# Patient Record
Sex: Female | Born: 1965 | Race: Black or African American | Hispanic: No | Smoking: Never smoker
Health system: Southern US, Community
[De-identification: ages and names within clinical notes are randomized; demographics above are authoritative.]

## PROBLEM LIST (undated history)

## (undated) DIAGNOSIS — E7800 Pure hypercholesterolemia, unspecified: Secondary | ICD-10-CM

## (undated) DIAGNOSIS — E079 Disorder of thyroid, unspecified: Secondary | ICD-10-CM

## (undated) HISTORY — PX: BREAST CYST ASPIRATION: SHX578

## (undated) HISTORY — PX: MYOMECTOMY: SHX85

---

## 2002-10-08 ENCOUNTER — Ambulatory Visit (HOSPITAL_COMMUNITY): Admission: RE | Admit: 2002-10-08 | Discharge: 2002-10-08 | Payer: Self-pay

## 2003-03-23 ENCOUNTER — Ambulatory Visit: Admission: RE | Admit: 2003-03-23 | Discharge: 2003-03-23 | Payer: Self-pay | Admitting: Gynecology

## 2003-03-30 ENCOUNTER — Inpatient Hospital Stay (HOSPITAL_COMMUNITY): Admission: RE | Admit: 2003-03-30 | Discharge: 2003-03-31 | Payer: Self-pay | Admitting: Gynecology

## 2003-03-30 ENCOUNTER — Encounter (INDEPENDENT_AMBULATORY_CARE_PROVIDER_SITE_OTHER): Payer: Self-pay | Admitting: *Deleted

## 2003-09-25 ENCOUNTER — Other Ambulatory Visit: Admission: RE | Admit: 2003-09-25 | Discharge: 2003-09-25 | Payer: Self-pay | Admitting: Gynecology

## 2005-06-26 ENCOUNTER — Other Ambulatory Visit: Admission: RE | Admit: 2005-06-26 | Discharge: 2005-06-26 | Payer: Self-pay | Admitting: Gynecology

## 2007-06-16 ENCOUNTER — Other Ambulatory Visit: Admission: RE | Admit: 2007-06-16 | Discharge: 2007-06-16 | Payer: Self-pay | Admitting: Gynecology

## 2007-08-26 ENCOUNTER — Ambulatory Visit (HOSPITAL_COMMUNITY): Admission: RE | Admit: 2007-08-26 | Discharge: 2007-08-26 | Payer: Self-pay | Admitting: Gynecology

## 2008-09-07 ENCOUNTER — Encounter: Admission: RE | Admit: 2008-09-07 | Discharge: 2008-09-07 | Payer: Self-pay | Admitting: Family Medicine

## 2008-11-06 ENCOUNTER — Ambulatory Visit (HOSPITAL_COMMUNITY): Admission: RE | Admit: 2008-11-06 | Discharge: 2008-11-06 | Payer: Self-pay | Admitting: Gynecology

## 2009-04-26 ENCOUNTER — Observation Stay (HOSPITAL_COMMUNITY): Admission: AD | Admit: 2009-04-26 | Discharge: 2009-04-28 | Payer: Self-pay | Admitting: Obstetrics and Gynecology

## 2009-04-27 ENCOUNTER — Encounter: Payer: Self-pay | Admitting: Obstetrics and Gynecology

## 2009-06-13 ENCOUNTER — Inpatient Hospital Stay (HOSPITAL_COMMUNITY): Admission: RE | Admit: 2009-06-13 | Discharge: 2009-06-16 | Payer: Self-pay | Admitting: Obstetrics and Gynecology

## 2010-06-23 LAB — STREP B DNA PROBE

## 2010-07-01 LAB — CBC
HCT: 25.3 % — ABNORMAL LOW (ref 36.0–46.0)
MCHC: 33.7 g/dL (ref 30.0–36.0)
MCV: 89.7 fL (ref 78.0–100.0)
Platelets: 265 10*3/uL (ref 150–400)
Platelets: 337 10*3/uL (ref 150–400)
RDW: 14.9 % (ref 11.5–15.5)

## 2010-07-01 LAB — RPR: RPR Ser Ql: NONREACTIVE

## 2010-08-20 NOTE — Letter (Signed)
November 06, 2008    Leatha Gilding. Mezer, MD  [address]  [place] [address]   REMarland Kitchen   VICTORIANA, AZIZ  MR#   [id number]  ACC#        [id number]   MFM CONSULTATION REPORT   Dear Dr. Chevis Pretty:   I first want to thank you for sending your patient Kari Garrett-  Hilda Blades to me for a pregnancy consultation.  As you know, she is a 45-  year-old gravida 2, para 0, African American female, who was recently  diagnosed with a early pregnancy by ultrasound in your office.  Her  pregnancy is complicated by the following:  1.  History of fibroids, status post myomectomy.  2.  Advanced maternal age - age 49.  3.  History of question of Graves disease, status post thyroid ablation.   I discussed the potential complications of these with her at the time of  the visit as outlined below.  1.  Fibroids.  The patient gives a history of having had a myomectomy in  2005.  At that time, she was told that she would need a cesarean section  secondary to the extensive nature of her myomectomy on ultrasound  performed October 30, 2008 at your office.  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  The largest of which was 8.3 cm x 5.7 cm.  I discussed with her  that 30-40% of women have fibroids and that fibroids do not necessarily  cause problems during pregnancy.  Specifically, however, I did discuss  with fibroids the size that she has.  There is an increased chance of  abnormal presentation and the need for serial ultrasounds for growth due  to the lack of validity of fundal height measurements.  I did discuss  the small increased risk of miscarriage secondary to the fibroids and  the small risk of placental abruption in a pregnancy secondary to the  multiple myomas.  I did discuss that if this is in fact a viable  pregnancy, she will need a cesarean section as previously planned  probably between 36 and 37 weeks' gestation.  2.  Advanced maternal age.  I discussed the 1-2% chance of  karyotype  abnormalities at the age of 3.  I discussed various noninvasive  screening procedures such as integrated screening and first trimester  screening with her.  In addition, I also discussed the possibility of an  amniocentesis for definitive diagnosis.  This should be noted that the  patient had bleeding approximately 1 week ago and on an ultrasound,  there was no cardiac activity noted and no yolk sac seen.  I did discuss  the increased risk of miscarriage, her maternal age, and that I would  recommend a followup ultrasound potentially this week to look for  viability with pregnancy.  3.  History of a question of Graves disease with thyroid ablation with  radioactive iodine in 1999.  I discussed that we would recommend  checking a thyroid stimulating immunoglobulin in this pregnancy and  follow with serial growth ultrasound to look for any evidence of fetal  hyperthyroidism if in fact, her PSIG was noted to be positive.     Please free to contact me if you would like [place] to follow her pregnancy.  If this pregnancy ends up being viable.  If you have any questions or  concerns concerning my recommendations, please feel free to contact me  directly.   Sincerely,      Onalee Hua  Eden Lathe, MD      DCM/MEDQ  D:  11/06/2008  T:  11/07/2008  Job:  [id number]

## 2010-08-23 NOTE — Op Note (Signed)
NAME:  Kari Garrett, Kari Garrett                      ACCOUNT NO.:  [id number]   MEDICAL RECORD NO.:  [id number]                   PATIENT TYPE:  INP   LOCATION:  0447                                 FACILITY:  [facility]   PHYSICIAN:  Howard C. Mezer, M.D.               DATE OF BIRTH:  1966-01-30   DATE OF PROCEDURE:  03/30/2003  DATE OF DISCHARGE:  03/31/2003                                 OPERATIVE REPORT   PREOPERATIVE DIAGNOSIS:  Fibroid uterus.   POSTOPERATIVE DIAGNOSIS:  1. Fibroid uterus.  2. Adhesions.   OPERATION PERFORMED:  Myomectomy and lysis of adhesions.   SURGEON:  Leatha Gilding. Mezer, M.D.   ASSISTANT:  Almedia Balls. Randell Patient, M.D.   ANESTHESIA:  General endotracheal.  Please note this operative note is being dictated on the 16th of February  2005.  This is the first time that I was notified that the operative note  from the surgery on March 30, 2003, had not been completed.  This must  have been an oversight at the time and unfortunately, there is no mechanism  in place in the medical records system to alert surgeons when operative  notes have not been completed within a short time after a surgical  procedure.  Unfortunately, no specific details can be recalled at this late  date, and I will dictate a note that outlines my general procedure for  performing a myomectomy and lysis of adhesions.   DESCRIPTION OF PROCEDURE:  With the patient in the supine position, was  prepped and draped in the routine fashion.  A pediatric Foley catheter had  been placed through the cervix to stain the cavity with concentrated indigo  carmine dye.  An incision was made through the skin and subcutaneous tissue  and the fascia and peritoneum opened.  There were adhesions noted around the  right tube and ovary, and these were lysed.  A total of nine fibroids were  removed from the uterus.  The normal procedure involves injecting a dilute  solution of Pitressin in the serosa.  Opening the serosa with  cautery,  staying as close to the midline as possible to reduce injury to the  fallopian tubes and bladder and the vasculature of the uterus, the fibroids  are shelled out, and there is no note in the hospital chart regarding entry  into the cavity.  There is a note that there was a lot of oozing.  The  myomectomy sites are closed with running and interrupted 0 chromic suture  and then usually PDS is used to close the serosa.  A great effort is made to  assure hemostasis to reduce the chance for adhesion formation.  The abdomen  is then closed in layers using a running 2-0 Vicryl on the peritoneum,  running 0 Vicryl to the midline bilaterally on the fascia.  Hemostasis is  assured in the subcutaneous tissue, and the skin is closed with staples.  The estimated blood loss is recorded at 600 mL.  The sponge, instrument, and  needle counts are verified x 2.  The patient tolerated the procedure well  and was taken to recovery room in satisfactory condition.                                               Leatha Gilding. Mezer, M.D.    HCM/MEDQ  D:  05/24/2003  T:  05/24/2003  Job:  [id number]   cc:   Almedia Balls. Fore, M.D.  [phone] [address] [place]  [place] [address]  Fax: [phone]

## 2010-08-23 NOTE — H&P (Signed)
NAME:  Kari Garrett, Kari Garrett                      ACCOUNT NO.:  [id number]   MEDICAL RECORD NO.:  [id number]                   PATIENT TYPE:  INP   LOCATION:  NA                                   FACILITY:  [facility]   PHYSICIAN:  Howard C. Mezer, M.D.               DATE OF BIRTH:  [birth year]   DATE OF ADMISSION:  03/30/2003  DATE OF DISCHARGE:                                HISTORY & PHYSICAL   ADMISSION DIAGNOSIS:  Fibroid uterus.   REASON FOR ADMISSION:  The patient is a 45 year old, gravida 1, abortus 1,  female admitted with a last menstrual period on March 28, 2003, with  large fibroid uterus for myomectomy, question total abdominal hysterectomy.  The patient was seen in consult at the request of Dr. Ronda Fairly. Tuso in  July 2004. At that time she had an approximately 18-week size uterus with  fibroids documented by examination, ultrasound, and MR. Options were  discussed at length with the patient and she has elected to proceed with a  myomectomy.  Exploratory laparotomy with myomectomy, question total  abdominal hysterectomy, has been discussed with the patient in detail.  Potential complications including but limited to anesthesia, injury to  bowel, bladder, or ureters, possible blood loss with transfusion and its  sequela, possible postoperative adhesions resulting in pain and decreased  fertility, and possible infection in the pelvis or in the wound have been  discussed in detail. If there is excess bleeding or if it is not possible to  reconstruct the uterus, a hysterectomy will need to be performed which will  result in permanent sterilization and the patient will never be able to  become pregnant or to carry a baby.  The potential for postoperative  adhesions has been stressed and there is absolutely no guarantee the patient  will become pregnant after the myomectomy.  It is probable that the patient  will need a Cesarean section should she become pregnant.  Postoperative  expectations and restrictions have been discussed in detail. Pain control  has been reviewed.  The increased risk of transfusion with this surgery has  been emphasized and again potential adverse outcomes have been reviewed. All  the patient's questions have been answered and she appeared to have  reasonable expectations regarding the surgery. She understands that her  obesity increases almost all the risks of the surgery.   PAST MEDICAL HISTORY:  Surgical: None.  Medical: Obesity, hypothyroid.   MEDICATIONS:  Synthroid.   ALLERGIES:  None known.   Smokes: None. ETOH: None. No herbs or supplements.   FAMILY HISTORY:  Positive for cancer, non-Hodgkins lymphoma in the patient's  mother.   SOCIAL HISTORY:  The patient works at [employer] and has  been married for three years.   PHYSICAL EXAMINATION:  HEENT: Negative.  LUNGS: Clear.  HEART: Without murmur.  BREASTS: Without masses or discharge.  ABDOMEN: Obese, soft, and nontender.  PELVIC EXAM: EG/BUS,  vagina, and cervix noted to be normal.  The uterus is  18-20 weeks in size and irregular. Adnexa without palpable masses.  EXTREMITIES: Negative.   IMPRESSION:  1. Fibroid uterus.  2. Obesity.  3. Hypothyroid.   PLAN:  Exploratory laparotomy with myomectomy; question total abdominal  hysterectomy.                                               Leatha Gilding. Mezer, M.D.    HCM/MEDQ  D:  03/29/2003  T:  03/29/2003  Job:  [id number]   cc:   Meredith Staggers, M.D.  [address]  [place]  [place] [address]  Fax: [phone]

## 2011-01-21 IMAGING — CR DG CERVICAL SPINE COMPLETE 4+V
5 series · 5 of 5 positions shown · non-contrast
Comparison: None

CLINICAL DATA: Left-sided neck pain.

CERVICAL SPINE - COMPLETE 4+ VIEW

[view not recorded (1 of 5)]
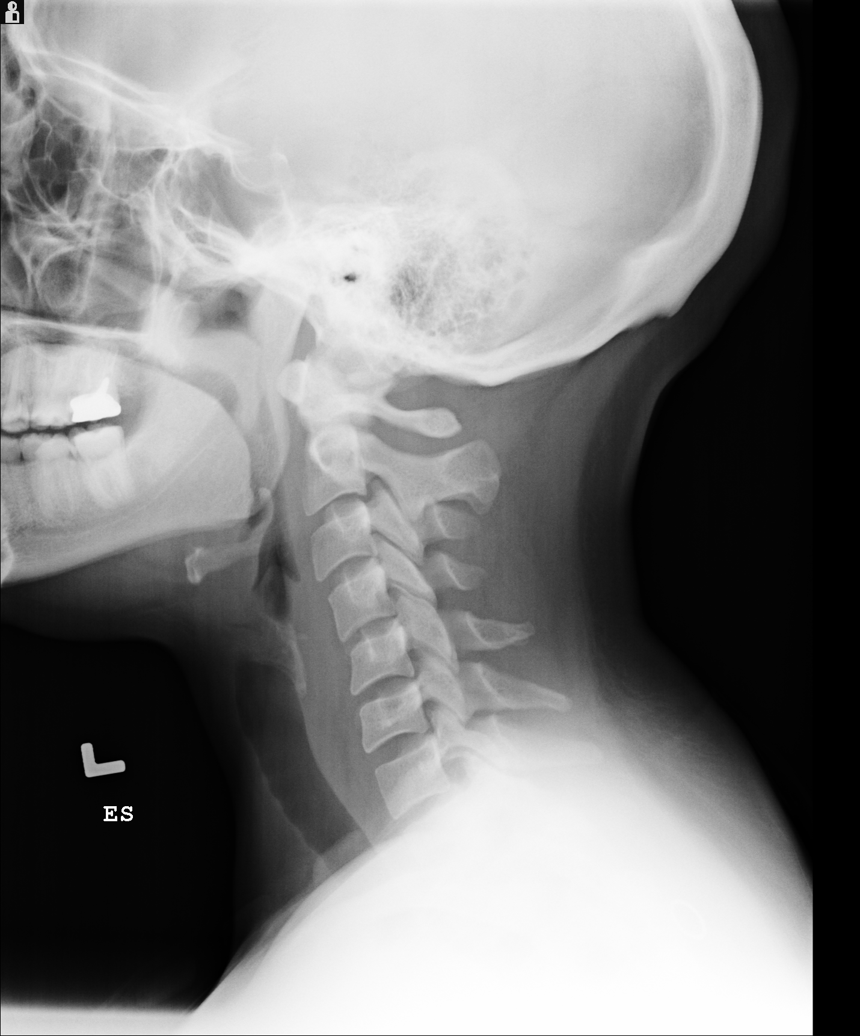

[view not recorded (2 of 5)]
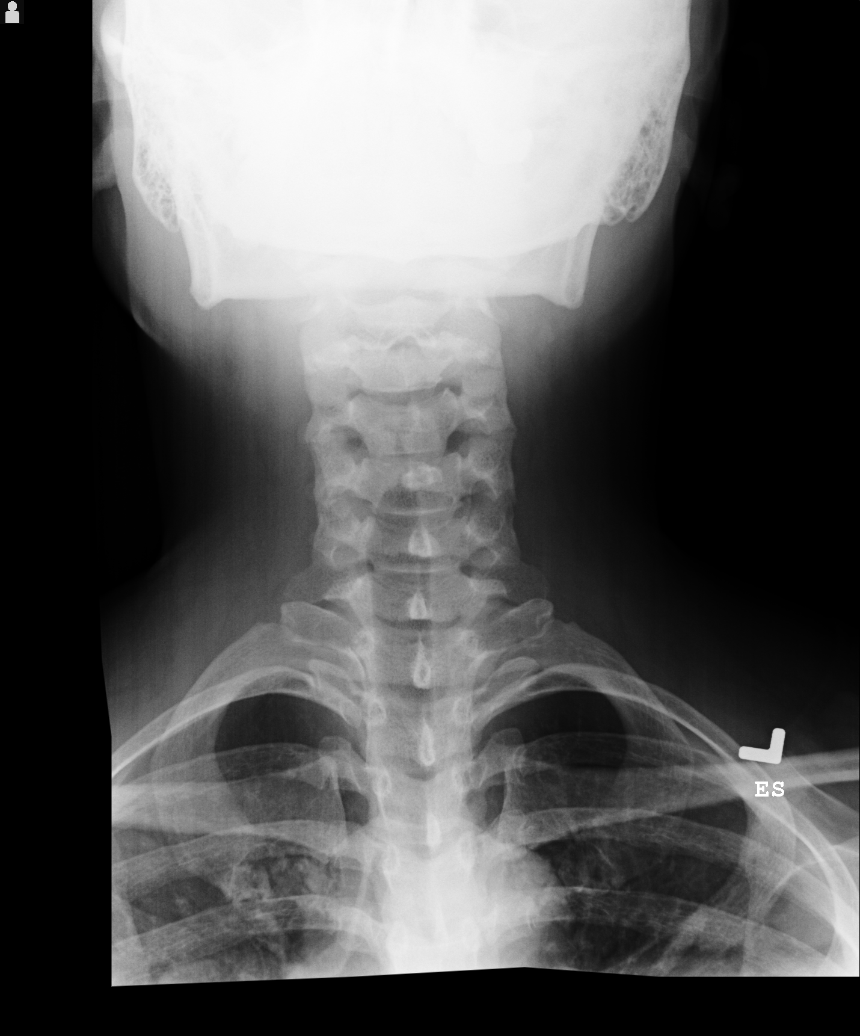

[view not recorded (3 of 5)]
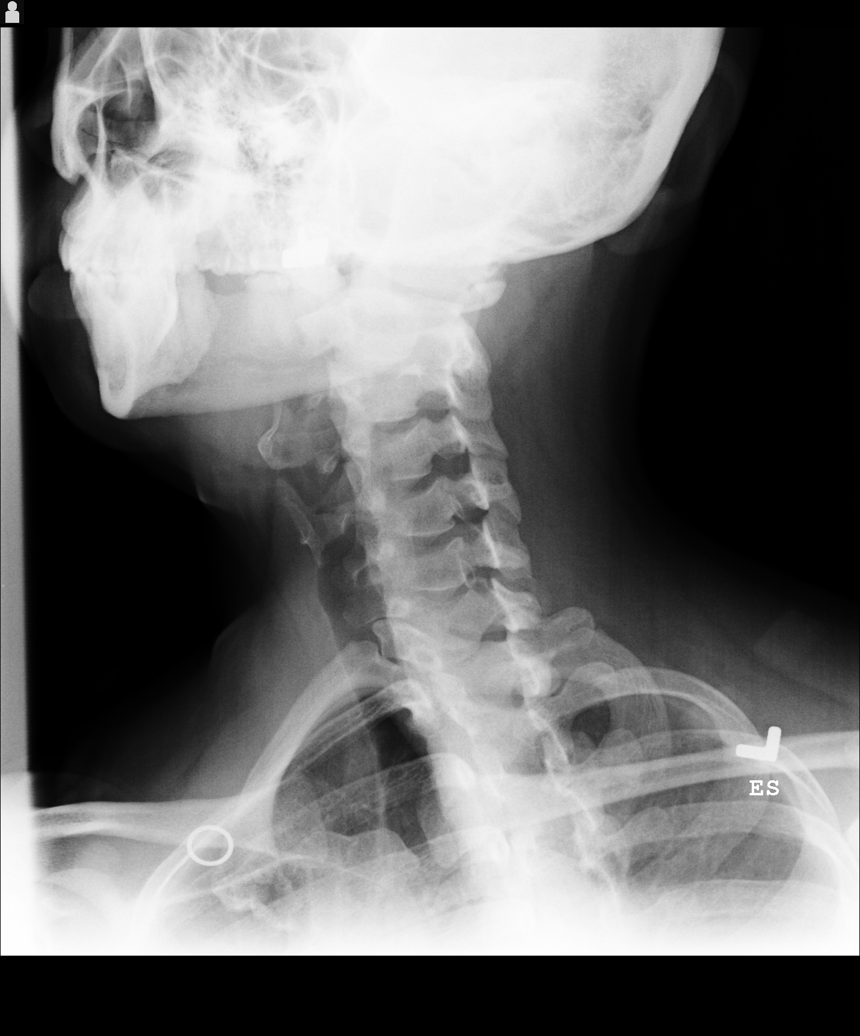

[view not recorded (4 of 5)]
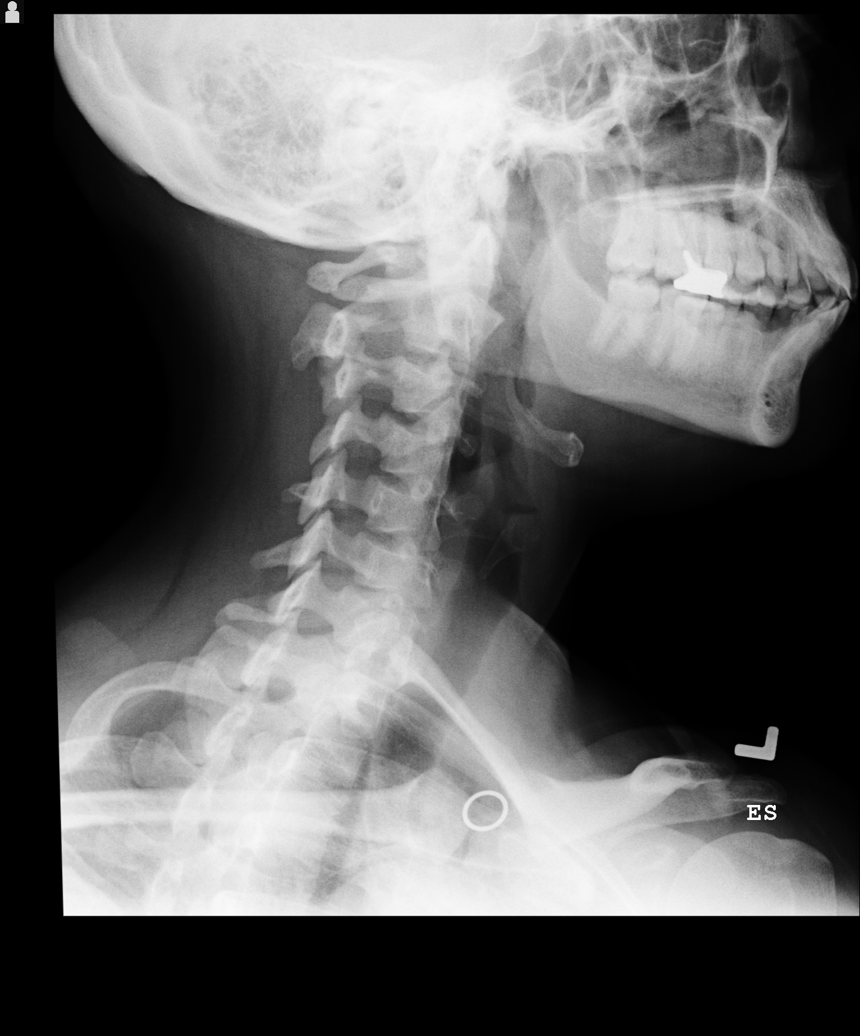

[view not recorded (5 of 5)]
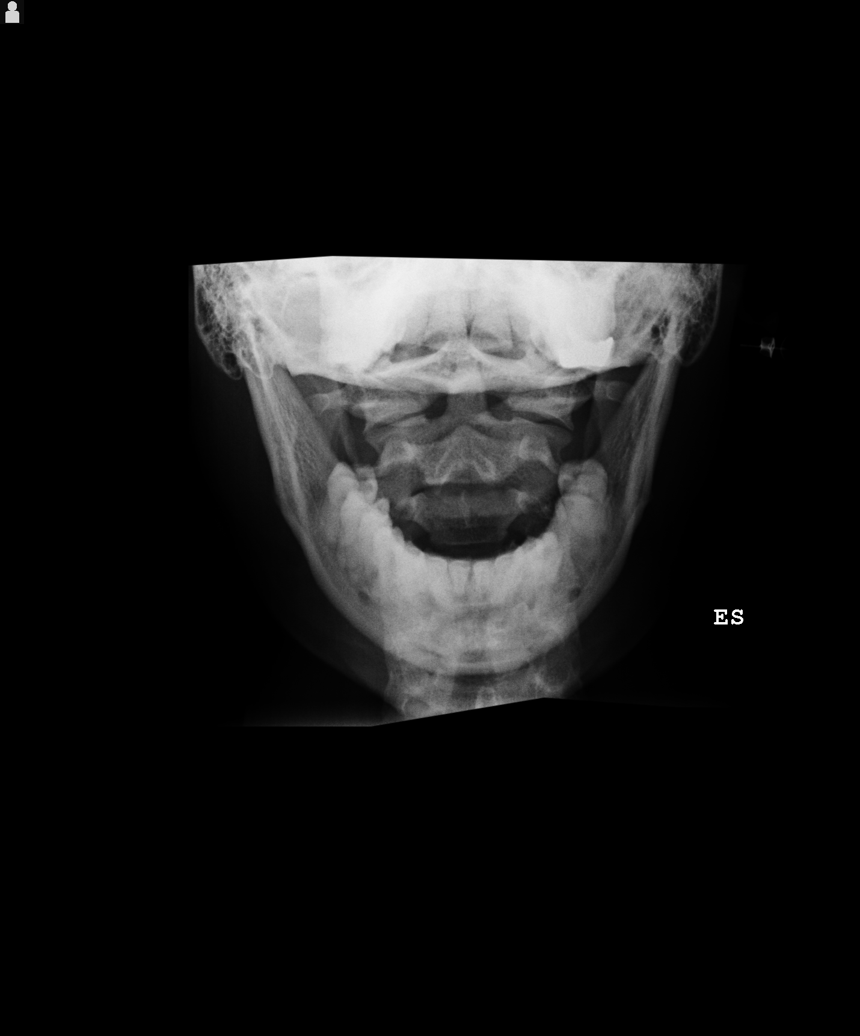

[5 of 5 positions shown; findings below may reference images not displayed]

FINDINGS: The cervical spine is visualized from the occiput to the
C7-T1 junction.  There is straightening of the normal cervical
lordosis without subluxation or fracture.  Prevertebral soft
tissues are within normal limits.  Mild uncovertebral hypertrophy
and loss of disc space height are seen at C4-5.  Neural foramina
appear grossly patent.  Dens is partially obscured on the dedicated
view.  Visualized lung apices appear clear.
IMPRESSION: Straightening of the normal cervical lordosis with very mild
degenerative disc disease at C4-5.

## 2014-01-12 ENCOUNTER — Other Ambulatory Visit: Payer: Self-pay | Admitting: Gynecology

## 2014-01-12 DIAGNOSIS — R928 Other abnormal and inconclusive findings on diagnostic imaging of breast: Secondary | ICD-10-CM

## 2014-01-30 ENCOUNTER — Ambulatory Visit
Admission: RE | Admit: 2014-01-30 | Discharge: 2014-01-30 | Disposition: A | Discharge status: Home / Self Care | Payer: BC Managed Care – PPO | Source: Ambulatory Visit | Attending: Gynecology | Admitting: Gynecology

## 2014-01-30 DIAGNOSIS — R928 Other abnormal and inconclusive findings on diagnostic imaging of breast: Secondary | ICD-10-CM

## 2014-06-09 ENCOUNTER — Other Ambulatory Visit: Payer: Self-pay | Admitting: Gynecology

## 2014-06-09 DIAGNOSIS — N6320 Unspecified lump in the left breast, unspecified quadrant: Secondary | ICD-10-CM

## 2014-06-14 ENCOUNTER — Ambulatory Visit
Admission: RE | Admit: 2014-06-14 | Discharge: 2014-06-14 | Disposition: A | Discharge status: Home / Self Care | Payer: BC Managed Care – PPO | Source: Ambulatory Visit | Attending: Gynecology | Admitting: Gynecology

## 2014-06-14 ENCOUNTER — Other Ambulatory Visit: Payer: Self-pay | Admitting: Gynecology

## 2014-06-14 DIAGNOSIS — N6320 Unspecified lump in the left breast, unspecified quadrant: Secondary | ICD-10-CM

## 2015-05-02 ENCOUNTER — Encounter (HOSPITAL_BASED_OUTPATIENT_CLINIC_OR_DEPARTMENT_OTHER): Payer: Self-pay

## 2015-05-02 ENCOUNTER — Emergency Department (HOSPITAL_BASED_OUTPATIENT_CLINIC_OR_DEPARTMENT_OTHER)
Admission: EM | Admit: 2015-05-02 | Discharge: 2015-05-02 | Disposition: A | Discharge status: Home / Self Care | Payer: BC Managed Care – PPO | Attending: Emergency Medicine | Admitting: Emergency Medicine

## 2015-05-02 DIAGNOSIS — E039 Hypothyroidism, unspecified: Secondary | ICD-10-CM | POA: Diagnosis not present

## 2015-05-02 DIAGNOSIS — R002 Palpitations: Secondary | ICD-10-CM | POA: Insufficient documentation

## 2015-05-02 DIAGNOSIS — E7800 Pure hypercholesterolemia, unspecified: Secondary | ICD-10-CM | POA: Insufficient documentation

## 2015-05-02 DIAGNOSIS — F419 Anxiety disorder, unspecified: Secondary | ICD-10-CM | POA: Diagnosis not present

## 2015-05-02 DIAGNOSIS — R251 Tremor, unspecified: Secondary | ICD-10-CM | POA: Insufficient documentation

## 2015-05-02 HISTORY — DX: Pure hypercholesterolemia, unspecified: E78.00

## 2015-05-02 HISTORY — DX: Disorder of thyroid, unspecified: E07.9

## 2015-05-02 LAB — CBC WITH DIFFERENTIAL/PLATELET
BASOS ABS: 0 10*3/uL (ref 0.0–0.1)
BASOS PCT: 0 %
EOS ABS: 0.2 10*3/uL (ref 0.0–0.7)
EOS PCT: 3 %
HCT: 36.5 % (ref 36.0–46.0)
Hemoglobin: 11.9 g/dL — ABNORMAL LOW (ref 12.0–15.0)
Lymphocytes Relative: 21 %
Lymphs Abs: 1.5 10*3/uL (ref 0.7–4.0)
MCH: 28.4 pg (ref 26.0–34.0)
MCHC: 32.6 g/dL (ref 30.0–36.0)
MCV: 87.1 fL (ref 78.0–100.0)
MONO ABS: 0.6 10*3/uL (ref 0.1–1.0)
MONOS PCT: 8 %
Neutro Abs: 4.9 10*3/uL (ref 1.7–7.7)
Neutrophils Relative %: 68 %
PLATELETS: 381 10*3/uL (ref 150–400)
RBC: 4.19 MIL/uL (ref 3.87–5.11)
RDW: 13.4 % (ref 11.5–15.5)
WBC: 7.3 10*3/uL (ref 4.0–10.5)

## 2015-05-02 LAB — T4, FREE: FREE T4: 1.16 ng/dL — AB (ref 0.61–1.12)

## 2015-05-02 LAB — BASIC METABOLIC PANEL
Anion gap: 9 (ref 5–15)
BUN: 12 mg/dL (ref 6–20)
CALCIUM: 9.3 mg/dL (ref 8.9–10.3)
CO2: 26 mmol/L (ref 22–32)
CREATININE: 0.87 mg/dL (ref 0.44–1.00)
Chloride: 105 mmol/L (ref 101–111)
Glucose, Bld: 98 mg/dL (ref 65–99)
Potassium: 3.8 mmol/L (ref 3.5–5.1)
SODIUM: 140 mmol/L (ref 135–145)

## 2015-05-02 LAB — TSH: TSH: 2.231 u[IU]/mL (ref 0.350–4.500)

## 2015-05-02 NOTE — ED Provider Notes (Signed)
CSN: [id number]     Arrival date & time 05/02/15  1615 History   First MD Initiated Contact with Patient 05/02/15 1636     Chief Complaint  Patient presents with  . Tachycardia     (Consider location/radiation/quality/duration/timing/severity/associated sxs/prior Treatment) Patient is a 50 y.o. female presenting with general illness. The history is provided by the patient.  Illness Severity:  Moderate Onset quality:  Sudden Duration:  2 weeks Timing:  Constant Progression:  Worsening Chronicity:  Recurrent Associated symptoms: no chest pain, no congestion, no fever, no headaches, no myalgias, no nausea, no rhinorrhea, no shortness of breath, no vomiting and no wheezing    50 yo F with a chief complaints of a feeling of palpitations as well as the jitteriness. This been going on for the past couple weeks. Patient has a history of hypothyroidism for which she takes levothyroxine. Patient recently had her dose changed and she feels like this may be the cause of her symptoms. Patient denies any fevers or chills denies any chest pain or shortness breath. Patient has been feeling mildly tired and having some insomnia. Patient denies any lower extremity edema.  Past Medical History  Diagnosis Date  . Thyroid disease   . High cholesterol    Past Surgical History  Procedure Laterality Date  . Myomectomy     No family history on file. Social History  Substance Use Topics  . Smoking status: Never Smoker   . Smokeless tobacco: None  . Alcohol Use: No   OB History    No data available     Review of Systems  Constitutional: Negative for fever and chills.  HENT: Negative for congestion and rhinorrhea.   Eyes: Negative for redness and visual disturbance.  Respiratory: Negative for shortness of breath and wheezing.   Cardiovascular: Positive for palpitations. Negative for chest pain.  Gastrointestinal: Negative for nausea and vomiting.  Genitourinary: Negative for dysuria and urgency.   Musculoskeletal: Negative for myalgias and arthralgias.  Skin: Negative for pallor and wound.  Neurological: Positive for tremors. Negative for dizziness and headaches.  Psychiatric/Behavioral: The patient is nervous/anxious.       Allergies  Review of patient's allergies indicates no known allergies.  Home Medications   Prior to Admission medications   Medication Sig Start Date End Date Taking? Authorizing Provider  Levothyroxine Sodium (SYNTHROID PO) Take by mouth.   Yes Historical Provider, MD  SIMVASTATIN PO Take by mouth.   Yes Historical Provider, MD   BP 143/97 mmHg  Pulse 85  Temp(Src) 98.8 F (37.1 C) (Oral)  Resp 18  Ht  (1.676 m)  Wt 192 lb (87.091 kg)  BMI 31.00 kg/m2  SpO2 100%  LMP 04/10/2015 Physical Exam  Constitutional: She is oriented to person, place, and time. She appears well-developed and well-nourished. No distress.  HENT:  Head: Normocephalic and atraumatic.  Eyes: EOM are normal. Pupils are equal, round, and reactive to light.  Neck: Normal range of motion. Neck supple.  Cardiovascular: Normal rate and regular rhythm.  Exam reveals no gallop and no friction rub.   No murmur heard. Pulmonary/Chest: Effort normal. She has no wheezes. She has no rales.  Abdominal: Soft. She exhibits no distension. There is no tenderness. There is no rebound and no guarding.  Musculoskeletal: She exhibits no edema or tenderness.  Neurological: She is alert and oriented to person, place, and time.  Skin: Skin is warm and dry. She is not diaphoretic.  Psychiatric: She has a normal mood and  affect. Her behavior is normal.  Nursing note and vitals reviewed.   ED Course  Procedures (including critical care time) Labs Review Labs Reviewed  T4, FREE - Abnormal; Notable for the following:    Free T4 1.16 (*)    All other components within normal limits  CBC WITH DIFFERENTIAL/PLATELET - Abnormal; Notable for the following:    Hemoglobin 11.9 (*)    All other  components within normal limits  TSH  BASIC METABOLIC PANEL    Imaging Review No results found. I have personally reviewed and evaluated these images and lab results as part of my medical decision-making.   EKG Interpretation   Date/Time:  Wednesday May 02 2015 16:58:05 EST Ventricular Rate:  78 PR Interval:  142 QRS Duration: 88 QT Interval:  374 QTC Calculation: 426 R Axis:   66 Text Interpretation:  Normal sinus rhythm Normal ECG No significant change  since last tracing Confirmed by Derrika Ruffalo MD, Reuel Boom ([id number]) on 05/02/2015  6:39:22 PM      MDM   Final diagnoses:  Palpitations    50 yo F with a chief complaints of palpitations and tachycardia. Patient states that this has happened to her before and was with her thyroid medication. Was also recently adjusted. Not tachycardic while in the ED. Will obtain a TSH and a free T4.  TSH is a send out. Patient will have her PCP check this.   11:29 PM:  I have discussed the diagnosis/risks/treatment options with the patient and believe the pt to be eligible for discharge home to follow-up with PCP. We also discussed returning to the ED immediately if new or worsening sx occur. We discussed the sx which are most concerning (e.g., sudden worsening pain, fever, inability to tolerate by mouth) that necessitate immediate return. Medications administered to the patient during their visit and any new prescriptions provided to the patient are listed below.  Medications given during this visit Medications - No data to display  Discharge Medication List as of 05/02/2015  7:15 PM      The patient appears reasonably screen and/or stabilized for discharge and I doubt any other medical condition or other [facility] requiring further screening, evaluation, or treatment in the ED at this time prior to discharge.    Melene Plan, DO 05/02/15 2330

## 2015-05-02 NOTE — ED Notes (Signed)
C/o tachycardia x 2 weeks

## 2015-05-02 NOTE — Discharge Instructions (Signed)
Nonspecific Tachycardia Tachycardia is a faster than normal heartbeat (more than 100 beats per minute). In adults, the heart normally beats between 60 and 100 times a minute. A fast heartbeat may be a normal response to exercise or stress. It does not necessarily mean that something is wrong. However, sometimes when your heart beats too fast it may not be able to pump enough blood to the rest of your body. This can result in chest pain, shortness of breath, dizziness, and even fainting. Nonspecific tachycardia means that the specific cause or pattern of your tachycardia is unknown. CAUSES  Tachycardia may be harmless or it may be due to a more serious underlying cause. Possible causes of tachycardia include:  Exercise or exertion.  Fever.  Pain or injury.  Infection.  Loss of body fluids (dehydration).  Overactive thyroid.  Lack of red blood cells (anemia).  Anxiety and stress.  Alcohol.  Caffeine.  Tobacco products.  Diet pills.  Illegal drugs.  Heart disease. SYMPTOMS  Rapid or irregular heartbeat (palpitations).  Suddenly feeling your heart beating (cardiac awareness).  Dizziness.  Tiredness (fatigue).  Shortness of breath.  Chest pain.  Nausea.  Fainting. DIAGNOSIS  Your caregiver will perform a physical exam and take your medical history. In some cases, a heart specialist (cardiologist) may be consulted. Your caregiver may also order:  Blood tests.  Electrocardiography. This test records the electrical activity of your heart.  A heart monitoring test. TREATMENT  Treatment will depend on the likely cause of your tachycardia. The goal is to treat the underlying cause of your tachycardia. Treatment methods may include:  Replacement of fluids or blood through an intravenous (IV) tube for moderate to severe dehydration or anemia.  New medicines or changes in your current medicines.  Diet and lifestyle changes.  Treatment for certain  infections.  Stress relief or relaxation methods. HOME CARE INSTRUCTIONS   Rest.  Drink enough fluids to keep your urine clear or pale yellow.  Do not smoke.  Avoid:  Caffeine.  Tobacco.  Alcohol.  Chocolate.  Stimulants such as over-the-counter diet pills or pills that help you stay awake.  Situations that cause anxiety or stress.  Illegal drugs such as marijuana, phencyclidine (PCP), and cocaine.  Only take medicine as directed by your caregiver.  Keep all follow-up appointments as directed by your caregiver. SEEK IMMEDIATE MEDICAL CARE IF:   You have pain in your chest, upper arms, jaw, or neck.  You become weak, dizzy, or feel faint.  You have palpitations that will not go away.  You vomit, have diarrhea, or pass blood in your stool.  Your skin is cool, pale, and wet.  You have a fever that will not go away with rest, fluids, and medicine. MAKE SURE YOU:   Understand these instructions.  Will watch your condition.  Will get help right away if you are not doing well or get worse.   This information is not intended to replace advice given to you by your health care provider. Make sure you discuss any questions you have with your health care provider.   Document Released: 05/01/2004 Document Revised: 06/16/2011 Document Reviewed: 10/06/2014 Elsevier Interactive Patient Education 2016 Elsevier Inc.  

## 2015-05-09 ENCOUNTER — Ambulatory Visit (INDEPENDENT_AMBULATORY_CARE_PROVIDER_SITE_OTHER): Payer: BC Managed Care – PPO | Admitting: Cardiology

## 2015-05-09 ENCOUNTER — Encounter: Payer: Self-pay | Admitting: Cardiology

## 2015-05-09 VITALS — BP 121/81 | HR 76 | Ht 66.0 in | Wt 187.8 lb

## 2015-05-09 DIAGNOSIS — R011 Cardiac murmur, unspecified: Secondary | ICD-10-CM | POA: Diagnosis not present

## 2015-05-09 DIAGNOSIS — R002 Palpitations: Secondary | ICD-10-CM | POA: Diagnosis not present

## 2015-05-09 DIAGNOSIS — E785 Hyperlipidemia, unspecified: Secondary | ICD-10-CM | POA: Diagnosis not present

## 2015-05-09 NOTE — Assessment & Plan Note (Signed)
Continue statin. 

## 2015-05-09 NOTE — Assessment & Plan Note (Signed)
No further symptoms. We can pursue a monitor in the future if needed.

## 2015-05-09 NOTE — Progress Notes (Signed)
   Referring-Dr Erling Conte  HPI: 50 year old female for evaluation of palpitations. Patient typically does not have dyspnea on exertion, orthopnea, PND, pedal edema, palpitations, syncope or chest pain. She was recently having lunch with a friend and felt general malaise. Her friend states the patient was "jittery and shaky". She was taken to the emergency room and her heart rate was 98. TSH was normal with mildly elevated free T4. Hemoglobin 11.9 and potassium normal. Patient has had no symptoms since that time. Cardiology asked to evaluate.  Current Outpatient Prescriptions  Medication Sig Dispense Refill  . levothyroxine (SYNTHROID, LEVOTHROID) 112 MCG tablet     . SIMVASTATIN PO Take by mouth.     No current facility-administered medications for this visit.    No Known Allergies   Past Medical History  Diagnosis Date  . Thyroid disease   . High cholesterol     Past Surgical History  Procedure Laterality Date  . Myomectomy      Social History   Social History  . Marital Status: Divorced    Spouse Name: N/A  . Number of Children: 1  . Years of Education: N/A   Occupational History  .      [employer]   Social History Main Topics  . Smoking status: Never Smoker   . Smokeless tobacco: Not on file  . Alcohol Use: 0.0 oz/week    0 Standard drinks or equivalent per week     Comment: Rarely  . Drug Use: No  . Sexual Activity: Not on file   Other Topics Concern  . Not on file   Social History Narrative    Family History  Problem Relation Age of Onset  . Non-Hodgkin's lymphoma Mother   . Diabetes Mother   . Hypertension Mother   . Hypertension Sister   . Hypertension Brother     ROS: no fevers or chills, productive cough, hemoptysis, dysphasia, odynophagia, melena, hematochezia, dysuria, hematuria, rash, seizure activity, orthopnea, PND, pedal edema, claudication. Remaining systems are negative.  Physical Exam:   Blood pressure 121/81, pulse  76, height  (1.676 m), weight 187 lb 12.8 oz (85.186 kg), last menstrual period 04/10/2015.  General:  Well developed/well nourished in NAD Skin warm/dry Patient not depressed No peripheral clubbing Back-normal HEENT-normal/normal eyelids Neck supple/normal carotid upstroke bilaterally; no bruits; no JVD; no thyromegaly chest - CTA/ normal expansion CV - RRR/normal S1 and S2; no rubs or gallops;  PMI nondisplaced, 2/6 systolic murmur left sternal border. Abdomen -NT/ND, no HSM, no mass, + bowel sounds, no bruit 2+ femoral pulses, no bruits Ext-no edema, chords, 2+ DP Neuro-grossly nonfocal  ECG 05/02/2015-sinus rhythm with no ST changes.

## 2015-05-09 NOTE — Assessment & Plan Note (Signed)
Echocardiogram to further assess. 

## 2015-05-09 NOTE — Patient Instructions (Signed)
Your physician has requested that you have an echocardiogram. Echocardiography is a painless test that uses sound waves to create images of your heart. It provides your doctor with information about the size and shape of your heart and how well your heart's chambers and valves are working. This procedure takes approximately one hour. There are no restrictions for this procedure.   Your physician recommends that you schedule a follow-up appointment in: AS NEEDED PENDING TEST RESULTS  

## 2015-05-16 ENCOUNTER — Ambulatory Visit (HOSPITAL_BASED_OUTPATIENT_CLINIC_OR_DEPARTMENT_OTHER)
Admission: RE | Admit: 2015-05-16 | Discharge: 2015-05-16 | Disposition: A | Discharge status: Home / Self Care | Payer: BC Managed Care – PPO | Source: Ambulatory Visit | Attending: Cardiology | Admitting: Cardiology

## 2015-05-16 DIAGNOSIS — I350 Nonrheumatic aortic (valve) stenosis: Secondary | ICD-10-CM | POA: Insufficient documentation

## 2015-05-16 DIAGNOSIS — R011 Cardiac murmur, unspecified: Secondary | ICD-10-CM | POA: Insufficient documentation

## 2015-05-16 DIAGNOSIS — E7800 Pure hypercholesterolemia, unspecified: Secondary | ICD-10-CM | POA: Insufficient documentation

## 2015-05-16 MED ORDER — PERFLUTREN LIPID MICROSPHERE
1.0000 mL | INTRAVENOUS | Status: AC | PRN
  Administered 2015-05-16: 2 mL via INTRAVENOUS
  Filled 2015-05-16: qty 10

## 2015-05-16 MED FILL — Perflutren Lipid Microsphere IV Susp 1.1 MG/ML: INTRAVENOUS | Qty: 10 | Status: AC

## 2015-05-16 NOTE — Progress Notes (Signed)
Echocardiogram 2D Echocardiogram with Definity has been performed.  Kari Garrett 05/16/2015, 12:02 PM

## 2015-05-16 NOTE — Procedures (Signed)
IV to Right ac removed.  Tip intact.  No redness or swelling at site  Pt tolerated procedure well.

## 2015-05-16 NOTE — Procedures (Signed)
#  20 IV started to right [facility] for Definity injection.  Flushed with 5 cc NS.  Secured with clear adhesive bandage.  Site clean and dry.  No signs of infiltration.  Good blood return.  Pt tolerated well.

## 2015-05-17 ENCOUNTER — Telehealth: Payer: Self-pay | Admitting: Cardiology

## 2015-05-17 NOTE — Telephone Encounter (Signed)
Follow up ° ° ° ° ° °Calling to get echo results °

## 2015-05-17 NOTE — Telephone Encounter (Signed)
Pt is calling back to speak with a nurse about the results to her Echo from yesterday. Please f/u with her  Thanks

## 2015-05-17 NOTE — Telephone Encounter (Signed)
Results given to pt by Brien Mates.

## 2016-08-19 ENCOUNTER — Other Ambulatory Visit: Payer: Self-pay | Admitting: Obstetrics and Gynecology

## 2016-08-19 DIAGNOSIS — N63 Unspecified lump in unspecified breast: Secondary | ICD-10-CM

## 2016-08-22 ENCOUNTER — Ambulatory Visit
Admission: RE | Admit: 2016-08-22 | Discharge: 2016-08-22 | Disposition: A | Discharge status: Home / Self Care | Payer: BC Managed Care – PPO | Source: Ambulatory Visit | Attending: Obstetrics and Gynecology | Admitting: Obstetrics and Gynecology

## 2016-08-22 DIAGNOSIS — N63 Unspecified lump in unspecified breast: Secondary | ICD-10-CM

## 2019-10-25 ENCOUNTER — Other Ambulatory Visit: Payer: Self-pay | Admitting: Obstetrics and Gynecology

## 2019-10-25 DIAGNOSIS — R928 Other abnormal and inconclusive findings on diagnostic imaging of breast: Secondary | ICD-10-CM

## 2019-11-03 ENCOUNTER — Ambulatory Visit
Admission: RE | Admit: 2019-11-03 | Discharge: 2019-11-03 | Disposition: A | Discharge status: Home / Self Care | Payer: BC Managed Care – PPO | Source: Ambulatory Visit | Attending: Obstetrics and Gynecology | Admitting: Obstetrics and Gynecology

## 2019-11-03 ENCOUNTER — Other Ambulatory Visit: Payer: Self-pay

## 2019-11-03 DIAGNOSIS — R928 Other abnormal and inconclusive findings on diagnostic imaging of breast: Secondary | ICD-10-CM

## 2023-11-27 ENCOUNTER — Encounter: Payer: Self-pay | Admitting: Internal Medicine

## 2023-11-27 ENCOUNTER — Other Ambulatory Visit: Payer: Self-pay

## 2023-11-27 ENCOUNTER — Ambulatory Visit: Payer: Self-pay | Admitting: Internal Medicine

## 2023-11-27 VITALS — BP 126/68 | HR 69 | Temp 97.7°F | Resp 20 | Ht 66.0 in | Wt 228.4 lb

## 2023-11-27 DIAGNOSIS — J310 Chronic rhinitis: Secondary | ICD-10-CM | POA: Diagnosis not present

## 2023-11-27 DIAGNOSIS — T781XXD Other adverse food reactions, not elsewhere classified, subsequent encounter: Secondary | ICD-10-CM | POA: Diagnosis not present

## 2023-11-27 DIAGNOSIS — L299 Pruritus, unspecified: Secondary | ICD-10-CM | POA: Diagnosis not present

## 2023-11-27 DIAGNOSIS — T781XXA Other adverse food reactions, not elsewhere classified, initial encounter: Secondary | ICD-10-CM

## 2023-11-27 NOTE — Progress Notes (Signed)
 NEW PATIENT Date of Service/Encounter:   11/27/2023 Referring provider: none-self referred Primary care provider: Seabron Lenis, MD  Subjective:  Kari Garrett is a 58 y.o. female presenting today for evaluation of pruritus and bumps after eating certain chips and chronic rhinitis.  History obtained from: chart review and patient.   Discussed the use of AI scribe software for clinical note transcription with the patient, who gave verbal consent to proceed.  History of Present Illness Kari Garrett is a 58 year old female who presents with itching and small bumps after consuming flavored potato chips.  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 - Pruritus and small papular eruptions on the arms after consuming flavored potato chips (sour cream and onion, mesquite barbecue) - Symptoms have developed recently despite prior regular consumption without issues - No symptoms with plain potato chips - Symptoms relieved by Benadryl - No angioedema or respiratory compromise, no stomach pain, nausea, vomiting or diarrhea, no coughing or wheezing or shortness of breath - No other food triggers identified - No known food allergies  Seasonal allergic rhinitis - Congestion and itchy eyes during spring to summer - Uses nasal spray and switches from contact lenses to glasses during allergy season - Claritin provides effective symptom control  Atopic and allergic history - No history of asthma or eczema - No known medication allergies - No recent changes in personal care products, though occasionally switches soaps - Started a new daily gummy vitamin without adverse effects - No other allergic reactions to foods or medications - Family history: daughter with asthma and eczema   Chart Review:  Reviewed PCP notes from referral 10/13/23: Patient reported itching after specific foods, tomato mention several times.  Requesting referral to allergist.  Past Medical  History: Past Medical History:  Diagnosis Date   High cholesterol    Thyroid  disease    Medication List:  Current Outpatient Medications  Medication Sig Dispense Refill   levothyroxine (SYNTHROID, LEVOTHROID) 112 MCG tablet      rosuvastatin (CRESTOR) 10 MG tablet Take 10 mg by mouth.     SIMVASTATIN PO Take by mouth.     amLODipine (NORVASC) 5 MG tablet Take 5 mg by mouth daily.     Multiple Vitamins-Minerals (MULTI FOR HER) TABS as directed Orally     nebivolol (BYSTOLIC) 5 MG tablet 1 tablet Orally Once a day     No current facility-administered medications for this visit.   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  [place]   ROS:  All other systems negative except as noted per HPI.  Objective:  Blood pressure 126/68, pulse 69, temperature 97.7 F (36.5 C), temperature source Temporal, resp. rate 20, height 5' 6 (1.676 m), weight 228 lb 6.4 oz (103.6 kg), SpO2 98%. Body  mass index is 36.86 kg/m. Physical Exam:  General Appearance:  Alert, cooperative, no distress, appears stated age  Head:  Normocephalic, without obvious abnormality, atraumatic  Eyes:  Conjunctiva clear, EOM's intact  Ears EACs normal bilaterally and normal TMs bilaterally  Nose: Nares normal, hypertrophic turbinates, normal mucosa, and no visible anterior polyps   Throat: Lips, tongue normal; teeth and gums normal, normal posterior oropharynx  Neck: Supple, symmetrical  Lungs:   clear to auscultation bilaterally, Respirations unlabored, no coughing  Heart:  regular rate and rhythm and no murmur, Appears well perfused  Extremities: No edema  Skin: Skin color, texture, turgor normal and no rashes or lesions on visualized portions of skin  Neurologic: No gross deficits   Diagnostics:  Labs:  Lab Orders  No laboratory test(s) ordered today     Assessment and Plan  Assessment and Plan Assessment & Plan 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 No issues with plain chips or other regular foods. Possible intolerance to MSG. No other symptoms such as difficulty breathing, vomiting, or diarrhea, which would suggest a more severe allergic reaction. No asthma or eczema. The condition appears benign and likely related to an intolerance rather than a true allergy. Skin testing is not indicated. - Avoid consumption of flavored potato chips that cause symptoms. - Monitor for symptoms with other foods and keep a journal of ingredients if symptoms occur. - Use Claritin for symptomatic relief of itching 10 mg-up to 20 mg twice daily for itching as needed. - Avoid regular use of Benadryl due to sedative effects; reserve for nighttime use if needed. - Return for evaluation if symptoms worsen or new symptoms develop, such as pronounced hives, vomiting, diarrhea, or respiratory symptoms.  Seasonal allergic rhinitis Seasonal allergic rhinitis with symptoms peaking from spring to summer. Symptoms include nasal congestion, itchy eyes, and difficulty wearing contact lenses. Currently managed with Claritin, which  provides adequate relief. No new medication allergies or changes in personal care products that could contribute to symptoms. - Continue using Claritin for symptom management.  10 mg daily as needed - Consider environmental allergy testing if symptoms become unmanageable or if further evaluation is desired.  Follow up : As needed It was a pleasure meeting you in clinic today! Thank you for allowing me to participate in your care.  Rocky Endow, MD [facility]    This note in its entirety was forwarded to the Provider who requested this consultation.  Other: none  Thank you for your kind referral. I appreciate the opportunity to take part in Judianne's care. Please do not hesitate to contact me with questions.  Sincerely,  Rocky Endow, MD [facility] 

## 2023-11-27 NOTE — Patient Instructions (Signed)
 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 No issues with plain chips or other regular foods. Possible intolerance to MSG. No other symptoms such as difficulty breathing, vomiting, or diarrhea, which would suggest a more severe allergic reaction. No asthma or eczema. The condition appears benign and likely related to an intolerance rather than a true allergy. Skin testing is not indicated. - Avoid consumption of flavored potato chips that cause symptoms. - Monitor for symptoms with other foods and keep a journal of ingredients if symptoms occur. - Use Claritin for symptomatic relief of itching 10 mg-up to 20 mg twice daily for itching as needed. - Avoid regular use of Benadryl due to sedative effects; reserve for nighttime use if needed. - Return for evaluation if symptoms worsen or new symptoms develop, such as pronounced hives, vomiting, diarrhea, or respiratory symptoms.  Seasonal allergic rhinitis Seasonal allergic rhinitis with symptoms peaking from spring to summer. Symptoms include nasal congestion, itchy eyes, and difficulty wearing contact lenses. Currently managed with Claritin, which provides adequate relief. No new medication allergies or changes in personal care products that could contribute to symptoms. - Continue using Claritin for symptom management.  10 mg daily as needed - Consider environmental allergy testing if symptoms become unmanageable or if further evaluation is desired.  Follow up : As needed It was a pleasure meeting you in clinic today! Thank you for allowing me to participate in your care.  Rocky Endow, MD [facility]
